# Patient Record
Sex: Female | Born: 2000 | Race: White | Hispanic: No | Marital: Single | Smoking: Never smoker
Health system: Southern US, Community
[De-identification: ages and names within clinical notes are randomized; demographics above are authoritative.]

---
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
## 2020-01-15 ENCOUNTER — Other Ambulatory Visit: Payer: Self-pay

## 2020-01-16 ENCOUNTER — Encounter: Payer: Self-pay | Admitting: Obstetrics & Gynecology

## 2020-01-16 ENCOUNTER — Ambulatory Visit (INDEPENDENT_AMBULATORY_CARE_PROVIDER_SITE_OTHER): Payer: Managed Care, Other (non HMO) | Admitting: Obstetrics & Gynecology

## 2020-01-16 VITALS — BP 112/72 | Ht 65.0 in | Wt 131.0 lb

## 2020-01-16 DIAGNOSIS — Z113 Encounter for screening for infections with a predominantly sexual mode of transmission: Secondary | ICD-10-CM | POA: Diagnosis not present

## 2020-01-16 DIAGNOSIS — Z01419 Encounter for gynecological examination (general) (routine) without abnormal findings: Secondary | ICD-10-CM

## 2020-01-16 DIAGNOSIS — Z3041 Encounter for surveillance of contraceptive pills: Secondary | ICD-10-CM | POA: Diagnosis not present

## 2020-01-16 MED ORDER — BLISOVI 24 FE 1-20 MG-MCG(24) PO TABS: ORAL_TABLET | ORAL | 4 refills | Status: DC

## 2020-01-16 NOTE — Progress Notes (Signed)
Misty Randolph 2001/01/22 [id number]   History:    19 y.o. G0 single.  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  RP:  Established patient presenting for annual gyn exam   HPI: Very well on Lomedia Fe 24 1/20.  Minimal BTB just prior to period.  No pelvic pain.  Sexually active with stable boyfriend.  No pain with IC.  No abnormal d/c.  Breasts wnl.  Urine/BMs normal.  BMI 21.8.  Very fit.  Received Gardasil previously.   Past medical history,surgical history, family history and social history were all reviewed and documented in the EPIC chart.  Gynecologic History Patient's last menstrual period was 01/02/2020.  Obstetric History OB History  Gravida Para Term Preterm AB Living  0 0 0 0 0 0  SAB TAB Ectopic Multiple Live Births  0 0 0 0 0     ROS: A ROS was performed and pertinent positives and negatives are included in the history.  GENERAL: No fevers or chills. HEENT: No change in vision, no earache, sore throat or sinus congestion. NECK: No pain or stiffness. CARDIOVASCULAR: No chest pain or pressure. No palpitations. PULMONARY: No shortness of breath, cough or wheeze. GASTROINTESTINAL: No abdominal pain, nausea, vomiting or diarrhea, melena or bright red blood per rectum. GENITOURINARY: No urinary frequency, urgency, hesitancy or dysuria. MUSCULOSKELETAL: No joint or muscle pain, no back pain, no recent trauma. DERMATOLOGIC: No rash, no itching, no lesions. ENDOCRINE: No polyuria, polydipsia, no heat or cold intolerance. No recent change in weight. HEMATOLOGICAL: No anemia or easy bruising or bleeding. NEUROLOGIC: No headache, seizures, numbness, tingling or weakness. PSYCHIATRIC: No depression, no loss of interest in normal activity or change in sleep pattern.     Exam:   BP 112/72   Ht 5\' 5"  (1.651 m)   Wt 131 lb (59.4 kg)   LMP 01/02/2020 Comment: PILL  BMI 21.80 kg/m   Body mass index is 21.8 kg/m.  General appearance : Well developed well nourished female. No acute distress HEENT: Eyes:  no retinal hemorrhage or exudates,  Neck supple, trachea midline, no carotid bruits, no thyroidmegaly Lungs: Clear to auscultation, no rhonchi or wheezes, or rib retractions  Heart: Regular rate and rhythm, no murmurs or gallops Breast:Examined in sitting and supine position were symmetrical in appearance, no palpable masses or tenderness,  no skin retraction, no nipple inversion, no nipple discharge, no skin discoloration, no axillary or supraclavicular lymphadenopathy Abdomen: no palpable masses or tenderness, no rebound or guarding Extremities: no edema or skin discoloration or tenderness  Pelvic: Vulva: Normal             Vagina: No gross lesions or discharge  Cervix: No gross lesions or discharge.  Gono-Chlam done.  Uterus  AV, normal size, shape and consistency, non-tender and mobile  Adnexa  Without masses or tenderness  Anus: Normal   Assessment/Plan:  19 y.o. female for annual exam   1. Well female exam with routine gynecological exam Normal gynecologic exam.  Will start Pap test at age 14.  Breast exam normal.  Good body mass index at 21.8.  Continue with fitness and healthy nutrition.  2. Encounter for surveillance of contraceptive pills Well on Blisovi 24 FE 1/20.  Very light breakthrough bleeding just prior to the periods.  Will change birth control pills if worsens.  No contraindication to continue on birth control pills.  Prescription sent to pharmacy.  3. Screen for STD (sexually transmitted disease) Recommend condom use.  STI screen with testing for gonorrhea and chlamydia today. -  C. trachomatis/N. gonorrhoeae RNA  Other orders - Norethindrone Acetate-Ethinyl Estrad-FE (BLISOVI 24 FE) 1-20 MG-MCG(24) tablet; 1 tab PO daily  Princess Bruins MD, 2:35 PM 01/16/2020

## 2020-01-16 NOTE — Patient Instructions (Signed)
1. Well female exam with routine gynecological exam Normal gynecologic exam.  Will start Pap test at age 19.  Breast exam normal.  Good body mass index at 21.8.  Continue with fitness and healthy nutrition.  2. Encounter for surveillance of contraceptive pills Well on Blisovi 24 FE 1/20.  Very light breakthrough bleeding just prior to the periods.  Will change birth control pills if worsens.  No contraindication to continue on birth control pills.  Prescription sent to pharmacy.  3. Screen for STD (sexually transmitted disease) Recommend condom use.  STI screen with testing for gonorrhea and chlamydia today. - C. trachomatis/N. gonorrhoeae RNA  Other orders - Norethindrone Acetate-Ethinyl Estrad-FE (BLISOVI 24 FE) 1-20 MG-MCG(24) tablet; 1 tab PO daily  Story, it was a pleasure seeing you today!  I will inform you of your results as soon as they are available.

## 2020-01-18 LAB — C. TRACHOMATIS/N. GONORRHOEAE RNA
C. trachomatis RNA, TMA: NOT DETECTED
N. gonorrhoeae RNA, TMA: NOT DETECTED

## 2021-01-16 ENCOUNTER — Encounter: Payer: Managed Care, Other (non HMO) | Admitting: Obstetrics & Gynecology

## 2021-01-24 ENCOUNTER — Other Ambulatory Visit: Payer: Self-pay

## 2021-01-24 ENCOUNTER — Ambulatory Visit (INDEPENDENT_AMBULATORY_CARE_PROVIDER_SITE_OTHER): Payer: Managed Care, Other (non HMO) | Admitting: Obstetrics & Gynecology

## 2021-01-24 ENCOUNTER — Encounter: Payer: Self-pay | Admitting: Obstetrics & Gynecology

## 2021-01-24 VITALS — BP 118/70 | Ht 65.0 in | Wt 136.0 lb

## 2021-01-24 DIAGNOSIS — N898 Other specified noninflammatory disorders of vagina: Secondary | ICD-10-CM

## 2021-01-24 DIAGNOSIS — Z01419 Encounter for gynecological examination (general) (routine) without abnormal findings: Secondary | ICD-10-CM

## 2021-01-24 DIAGNOSIS — Z3041 Encounter for surveillance of contraceptive pills: Secondary | ICD-10-CM

## 2021-01-24 LAB — WET PREP FOR TRICH, YEAST, CLUE

## 2021-01-24 MED ORDER — FLUCONAZOLE 150 MG PO TABS: 150.0000 mg | ORAL_TABLET | Freq: Every day | ORAL | 3 refills | Status: AC

## 2021-01-24 MED ORDER — BLISOVI 24 FE 1-20 MG-MCG(24) PO TABS
ORAL_TABLET | ORAL | 5 refills | Status: DC
Start: 2021-01-24 — End: 2021-03-07

## 2021-01-24 MED ORDER — TINIDAZOLE 500 MG PO TABS: 1000.0000 mg | ORAL_TABLET | Freq: Two times a day (BID) | ORAL | 0 refills | Status: AC

## 2021-01-24 NOTE — Progress Notes (Signed)
Misty Randolph 01/26/2001 [id number]   History:    20 y.o. G0 single. Sophomore at [employer].  [id number] presenting for annual gyn exam   KYH:CWCB well on Lomedia Fe 24 1/20. Minimal BTB just prior to period. No pelvic pain. Sexually active with stable boyfriend.  No pain with IC.  No abnormal d/c. Breasts wnl. Urine/BMs normal. BMI 22.63. Very fit. Received Gardasil previously.  Past medical history,surgical history, family history and social history were all reviewed and documented in the EPIC chart.  Gynecologic History Patient's last menstrual period was 11/24/2020.  Obstetric History OB History  Gravida Para Term Preterm AB Living  0 0 0 0 0 0  SAB IAB Ectopic Multiple Live Births  0 0 0 0 0     ROS: A ROS was performed and pertinent positives and negatives are included in the history.  GENERAL: No fevers or chills. HEENT: No change in vision, no earache, sore throat or sinus congestion. NECK: No pain or stiffness. CARDIOVASCULAR: No chest pain or pressure. No palpitations. PULMONARY: No shortness of breath, cough or wheeze. GASTROINTESTINAL: No abdominal pain, nausea, vomiting or diarrhea, melena or bright red blood per rectum. GENITOURINARY: No urinary frequency, urgency, hesitancy or dysuria. MUSCULOSKELETAL: No joint or muscle pain, no back pain, no recent trauma. DERMATOLOGIC: No rash, no itching, no lesions. ENDOCRINE: No polyuria, polydipsia, no heat or cold intolerance. No recent change in weight. HEMATOLOGICAL: No anemia or easy bruising or bleeding. NEUROLOGIC: No headache, seizures, numbness, tingling or weakness. PSYCHIATRIC: No depression, no loss of interest in normal activity or change in sleep pattern.     Exam:   BP 118/70   Ht 5\' 5"  (1.651 m)   Wt 136 lb (61.7 kg)   LMP 11/24/2020 Comment: pill  BMI 22.63 kg/m   Body mass index is 22.63 kg/m.  General appearance : Well developed well nourished female. No acute distress HEENT:  Eyes: no retinal hemorrhage or exudates,  Neck supple, trachea midline, no carotid bruits, no thyroidmegaly Lungs: Clear to auscultation, no rhonchi or wheezes, or rib retractions  Heart: Regular rate and rhythm, no murmurs or gallops Breast:Examined in sitting and supine position were symmetrical in appearance, no palpable masses or tenderness,  no skin retraction, no nipple inversion, no nipple discharge, no skin discoloration, no axillary or supraclavicular lymphadenopathy Abdomen: no palpable masses or tenderness, no rebound or guarding Extremities: no edema or skin discoloration or tenderness  Pelvic: Vulva: Normal             Vagina: No gross lesions.  Thick vaginal d/c c/w yeast.  Wet prep done.  Cervix: No gross lesions or discharge  Uterus  AV, normal size, shape and consistency, non-tender and mobile  Adnexa  Without masses or tenderness  Anus: Normal  Wet prep:  Yeasts and Clue Cells present   Assessment/Plan:  20 y.o. female for annual exam   1. Well female exam with routine gynecological exam Normal gynecologic exam.  We will start Pap test next year at age 7.  Declines STI screening.  Breast exam normal.  Good body mass index at 22.63.  Continue with fitness and healthy nutrition.  2. Encounter for surveillance of contraceptive pills Well on Blisovi 24 FE 1/20.  Continuous use.  Occasional mild breakthrough bleeding.  No contraindication to continue.  Prescription sent to pharmacy.  3. Vaginal discharge Wet prep confirmed both bacterial vaginosis and yeast vaginitis.  Counseling done.  Will treat with tinidazole 2 tablets per mouth twice a  day for 2 days and fluconazole 150 mg 1 tablet per mouth daily for 3 days.  Fluconazole refills sent. - WET PREP FOR TRICH, YEAST, CLUE  Other orders - Norethindrone Acetate-Ethinyl Estrad-FE (BLISOVI 24 FE) 1-20 MG-MCG(24) tablet; 1 tab PO daily, may use continuously - tinidazole (TINDAMAX) 500 MG tablet; Take 2 tablets (1,000 mg  total) by mouth 2 (two) times daily for 2 days. - fluconazole (DIFLUCAN) 150 MG tablet; Take 1 tablet (150 mg total) by mouth daily for 3 days.  Genia Del MD, 1:53 PM 01/24/2021

## 2021-03-07 ENCOUNTER — Other Ambulatory Visit: Payer: Self-pay | Admitting: Obstetrics & Gynecology

## 2021-10-13 ENCOUNTER — Telehealth: Payer: Self-pay | Admitting: *Deleted

## 2021-10-13 NOTE — Telephone Encounter (Signed)
Patient called and left message in triage voicemail c/o vaginal odor reports it BV. She asked if Rx could be sent to pharmacy, patient is away in college. I advised patient office visit is needed for this other option would be schedule visit at student health. Patient verbalized she understood.

## 2022-01-27 ENCOUNTER — Other Ambulatory Visit (HOSPITAL_COMMUNITY)
Admission: RE | Admit: 2022-01-27 | Discharge: 2022-01-27 | Disposition: A | Discharge status: Home / Self Care | Payer: Self-pay | Source: Ambulatory Visit | Attending: Obstetrics & Gynecology | Admitting: Obstetrics & Gynecology

## 2022-01-27 ENCOUNTER — Encounter: Payer: Self-pay | Admitting: Obstetrics & Gynecology

## 2022-01-27 ENCOUNTER — Ambulatory Visit (INDEPENDENT_AMBULATORY_CARE_PROVIDER_SITE_OTHER): Payer: Managed Care, Other (non HMO) | Admitting: Obstetrics & Gynecology

## 2022-01-27 VITALS — BP 106/70 | HR 68 | Resp 16 | Ht 65.25 in | Wt 144.0 lb

## 2022-01-27 DIAGNOSIS — Z01419 Encounter for gynecological examination (general) (routine) without abnormal findings: Secondary | ICD-10-CM

## 2022-01-27 DIAGNOSIS — N898 Other specified noninflammatory disorders of vagina: Secondary | ICD-10-CM | POA: Diagnosis not present

## 2022-01-27 DIAGNOSIS — Z3041 Encounter for surveillance of contraceptive pills: Secondary | ICD-10-CM | POA: Diagnosis not present

## 2022-01-27 LAB — WET PREP FOR TRICH, YEAST, CLUE

## 2022-01-27 MED ORDER — HAILEY 24 FE 1-20 MG-MCG(24) PO TABS: ORAL_TABLET | ORAL | 4 refills | Status: DC

## 2022-01-27 MED ORDER — TINIDAZOLE 500 MG PO TABS: 1000.0000 mg | ORAL_TABLET | Freq: Two times a day (BID) | ORAL | 0 refills | Status: AC

## 2022-01-27 MED ORDER — FLUCONAZOLE 150 MG PO TABS: 150.0000 mg | ORAL_TABLET | ORAL | 0 refills | Status: AC

## 2022-01-27 NOTE — Progress Notes (Signed)
Misty Randolph Nov 17, 2000 [id number]   History:    21 y.o.  G0 single.  Graduated from [employer].  Working in [employer] in [employer].   RP:  Established patient presenting for annual gyn exam    HPI: Very well on Lomedia Fe 24 1/20.  No BTB.  Normal light periods.  No pelvic pain.  Sexually active with stable boyfriend.  No pain with IC.  C/O vaginal discharge with odor.  Treated with ABTx.  Still has a discharge.   Breasts wnl.  Urine/BMs normal. BMI 23.78.  Very fit.  Received Gardasil previously.    Past medical history,surgical history, family history and social history were all reviewed and documented in the EPIC chart.  Gynecologic History No LMP recorded. (Menstrual status: Oral contraceptives).  Obstetric History OB History  Gravida Para Term Preterm AB Living  0 0 0 0 0 0  SAB IAB Ectopic Multiple Live Births  0 0 0 0 0     ROS: A ROS was performed and pertinent positives and negatives are included in the history.  GENERAL: No fevers or chills. HEENT: No change in vision, no earache, sore throat or sinus congestion. NECK: No pain or stiffness. CARDIOVASCULAR: No chest pain or pressure. No palpitations. PULMONARY: No shortness of breath, cough or wheeze. GASTROINTESTINAL: No abdominal pain, nausea, vomiting or diarrhea, melena or bright red blood per rectum. GENITOURINARY: No urinary frequency, urgency, hesitancy or dysuria. MUSCULOSKELETAL: No joint or muscle pain, no back pain, no recent trauma. DERMATOLOGIC: No rash, no itching, no lesions. ENDOCRINE: No polyuria, polydipsia, no heat or cold intolerance. No recent change in weight. HEMATOLOGICAL: No anemia or easy bruising or bleeding. NEUROLOGIC: No headache, seizures, numbness, tingling or weakness. PSYCHIATRIC: No depression, no loss of interest in normal activity or change in sleep pattern.     Exam:   BP 106/70   Pulse 68   Resp 16   Ht 5' 5.25" (1.657 m)   Wt 144 lb (65.3 kg)   BMI 23.78 kg/m    Body mass index is 23.78 kg/m.  General appearance : Well developed well nourished female. No acute distress HEENT: Eyes: no retinal hemorrhage or exudates,  Neck supple, trachea midline, no carotid bruits, no thyroidmegaly Lungs: Clear to auscultation, no rhonchi or wheezes, or rib retractions  Heart: Regular rate and rhythm, no murmurs or gallops Breast:Examined in sitting and supine position were symmetrical in appearance, no palpable masses or tenderness,  no skin retraction, no nipple inversion, no nipple discharge, no skin discoloration, no axillary or supraclavicular lymphadenopathy Abdomen: no palpable masses or tenderness, no rebound or guarding Extremities: no edema or skin discoloration or tenderness  Pelvic: Vulva: Normal             Vagina: No gross lesions.  Increased thick discharge.  Wet prep done.  Cervix: No gross lesions or discharge.  Pap reflex/Gono-Chlam  Uterus  AV, normal size, shape and consistency, non-tender and mobile  Adnexa  Without masses or tenderness  Anus: Normal  Wet Prep:  Clue cells with odor/Yeasts present   Assessment/Plan:  21 y.o. female for annual exam   1. Encounter for routine gynecological examination with Papanicolaou smear of cervix Very well on Lomedia Fe 24 1/20.  No BTB.  Normal light periods.  No pelvic pain.  Sexually active with stable boyfriend.  No pain with IC.  C/O vaginal discharge with odor.  Treated with ABTx.  Still has a discharge.   Breasts wnl.  Urine/BMs normal. BMI  23.78.  Very fit.  Received Gardasil previously.  - Cytology - PAP( [facility])  2. Encounter for surveillance of contraceptive pills Very well on Lomedia Fe 24 1/20.  No BTB.  Normal light periods.  No pelvic pain.  Sexually active with stable boyfriend.  No pain with IC.  No CI to BCPs.  Lomedia Fe 24 1/20 prescription sent to pharmacy.  3. Vaginal discharge Bacterial vaginosis and Yeast vaginitis confirmed by Wet Prep.  Will treat with Tinidazole,  followed by Fluconazole.  Usage reviewed and prescription sent to pharmacy. - WET PREP FOR TRICH, YEAST, CLUE  Other orders - Probiotic Product (PROBIOTIC PO); Take by mouth. - Norethindrone Acetate-Ethinyl Estrad-FE (HAILEY 24 FE) 1-20 MG-MCG(24) tablet; Take 1 tablet by mouth daily, may use continuously - tinidazole (TINDAMAX) 500 MG tablet; Take 2 tablets (1,000 mg total) by mouth 2 (two) times daily for 2 days. - fluconazole (DIFLUCAN) 150 MG tablet; Take 1 tablet (150 mg total) by mouth every other day for 3 doses.   Genia Del MD, 1:39 PM 01/27/2022

## 2022-01-28 LAB — CYTOLOGY - PAP
Chlamydia: NEGATIVE
Comment: NEGATIVE
Comment: NORMAL
Diagnosis: NEGATIVE
Neisseria Gonorrhea: NEGATIVE

## 2022-04-23 ENCOUNTER — Other Ambulatory Visit: Payer: Self-pay | Admitting: Obstetrics & Gynecology

## 2022-09-16 ENCOUNTER — Other Ambulatory Visit: Payer: Self-pay | Admitting: Obstetrics & Gynecology

## 2022-09-16 NOTE — Telephone Encounter (Signed)
Pharmacy comment: Alternative Requested:WE HAVE BEEN ORDERING. ON BACKORDER.

## 2023-02-03 ENCOUNTER — Ambulatory Visit (INDEPENDENT_AMBULATORY_CARE_PROVIDER_SITE_OTHER): Payer: Managed Care, Other (non HMO) | Admitting: Obstetrics & Gynecology

## 2023-02-03 ENCOUNTER — Encounter: Payer: Self-pay | Admitting: Obstetrics & Gynecology

## 2023-02-03 ENCOUNTER — Other Ambulatory Visit (HOSPITAL_COMMUNITY)
Admission: RE | Admit: 2023-02-03 | Discharge: 2023-02-03 | Disposition: A | Discharge status: Home / Self Care | Payer: Self-pay | Source: Ambulatory Visit | Attending: Obstetrics & Gynecology | Admitting: Obstetrics & Gynecology

## 2023-02-03 VITALS — HR 78 | Ht 65.0 in | Wt 148.0 lb

## 2023-02-03 DIAGNOSIS — N760 Acute vaginitis: Secondary | ICD-10-CM | POA: Diagnosis not present

## 2023-02-03 DIAGNOSIS — Z01419 Encounter for gynecological examination (general) (routine) without abnormal findings: Secondary | ICD-10-CM | POA: Insufficient documentation

## 2023-02-03 DIAGNOSIS — Z3041 Encounter for surveillance of contraceptive pills: Secondary | ICD-10-CM

## 2023-02-03 DIAGNOSIS — B3731 Acute candidiasis of vulva and vagina: Secondary | ICD-10-CM

## 2023-02-03 DIAGNOSIS — N898 Other specified noninflammatory disorders of vagina: Secondary | ICD-10-CM

## 2023-02-03 LAB — WET PREP FOR TRICH, YEAST, CLUE

## 2023-02-03 MED ORDER — FLUCONAZOLE 150 MG PO TABS: 150.0000 mg | ORAL_TABLET | ORAL | 2 refills | Status: AC

## 2023-02-03 MED ORDER — TINIDAZOLE 500 MG PO TABS: 1000.0000 mg | ORAL_TABLET | Freq: Two times a day (BID) | ORAL | 2 refills | Status: AC

## 2023-02-03 MED ORDER — HAILEY 24 FE 1-20 MG-MCG(24) PO TABS: ORAL_TABLET | ORAL | 4 refills | Status: DC

## 2023-02-03 NOTE — Progress Notes (Signed)
Misty Randolph 08-25-2001 [id number]   History:    22 y.o. G0 single.  Graduated from [employer].  Working in Freight forwarder in [employer].   RP:  Established patient presenting for annual gyn exam    HPI: Very well on Lomedia Fe 24 1/20.  No BTB. Normal light periods. No pelvic pain.  Sexually active with stable boyfriend. No pain with IC.  Pap Neg 01/2022. Pap reflex today.  C/O vaginal discharge with odor. Breasts wnl.  Urine/BMs normal. BMI 24.63.  Very fit. Received Gardasil previously.   Past medical history,surgical history, family history and social history were all reviewed and documented in the EPIC chart.  Gynecologic History No LMP recorded. (Menstrual status: Oral contraceptives).  Obstetric History OB History  Gravida Para Term Preterm AB Living  0 0 0 0 0 0  SAB IAB Ectopic Multiple Live Births  0 0 0 0 0     ROS: A ROS was performed and pertinent positives and negatives are included in the history. GENERAL: No fevers or chills. HEENT: No change in vision, no earache, sore throat or sinus congestion. NECK: No pain or stiffness. CARDIOVASCULAR: No chest pain or pressure. No palpitations. PULMONARY: No shortness of breath, cough or wheeze. GASTROINTESTINAL: No abdominal pain, nausea, vomiting or diarrhea, melena or bright red blood per rectum. GENITOURINARY: No urinary frequency, urgency, hesitancy or dysuria. MUSCULOSKELETAL: No joint or muscle pain, no back pain, no recent trauma. DERMATOLOGIC: No rash, no itching, no lesions. ENDOCRINE: No polyuria, polydipsia, no heat or cold intolerance. No recent change in weight. HEMATOLOGICAL: No anemia or easy bruising or bleeding. NEUROLOGIC: No headache, seizures, numbness, tingling or weakness. PSYCHIATRIC: No depression, no loss of interest in normal activity or change in sleep pattern.     Exam:   Pulse 78   Ht 5\' 5"  (1.651 m)   Wt 148 lb (67.1 kg)   SpO2 97%   BMI 24.63 kg/m   Body mass index is 24.63  kg/m.  General appearance : Well developed well nourished female. No acute distress HEENT: Eyes: no retinal hemorrhage or exudates,  Neck supple, trachea midline, no carotid bruits, no thyroidmegaly Lungs: Clear to auscultation, no rhonchi or wheezes, or rib retractions  Heart: Regular rate and rhythm, no murmurs or gallops Breast:Examined in sitting and supine position were symmetrical in appearance, no palpable masses or tenderness,  no skin retraction, no nipple inversion, no nipple discharge, no skin discoloration, no axillary or supraclavicular lymphadenopathy Abdomen: no palpable masses or tenderness, no rebound or guarding Extremities: no edema or skin discoloration or tenderness  Pelvic: Vulva: Normal             Vagina: No gross lesions. Thick discharge present.  Wet prep done.  Cervix: No gross lesions.  Pap reflex done.  Uterus  AV, normal size, shape and consistency, non-tender and mobile  Adnexa  Without masses or tenderness  Anus: Normal  Wet Prep:  Clue cells with odor and yeasts present with pseudohyphae.   Assessment/Plan:  22 y.o. female for annual exam   1. Encounter for routine gynecological examination with Papanicolaou smear of cervix Very well on Lomedia Fe 24 1/20.  No BTB. Normal light periods. No pelvic pain.  Sexually active with stable boyfriend. No pain with IC.  Pap Neg 01/2022. Pap reflex today.  C/O vaginal discharge with odor. Breasts wnl.  Urine/BMs normal. BMI 24.63.  Very fit. Received Gardasil previously. - Cytology - PAP( [place])  2. Encounter for surveillance of contraceptive pills  Very well on Lomedia Fe 24 1/20.  No BTB. Normal light periods. No pelvic pain.  Sexually active with stable boyfriend. No pain with IC.  No CI to continue with the BCPs.  Prescription sent to pharmacy.  3. Vaginal discharge BV and yeast vaginitis confirmed by Wet prep.  Tinidazole/Fluconazole prescriptions sent to pharmacy. - WET PREP FOR TRICH, YEAST,  CLUE  Other orders - Norethindrone Acetate-Ethinyl Estrad-FE (HAILEY 24 FE) 1-20 MG-MCG(24) tablet; 1 tab PO daily - tinidazole (TINDAMAX) 500 MG tablet; Take 2 tablets (1,000 mg total) by mouth 2 (two) times daily for 2 days. - fluconazole (DIFLUCAN) 150 MG tablet; Take 1 tablet (150 mg total) by mouth every other day for 3 doses.   Genia Del MD, 3:59 PM

## 2023-02-05 ENCOUNTER — Telehealth: Payer: Self-pay

## 2023-02-05 NOTE — Telephone Encounter (Signed)
Pt LVM in triage line stating that she was recently prescribed fluconazole for yeast and has concerns because the instructions of the prescription says to take for 3 doses. However, the pharmacy only dispensed 2 doses due to insurance only covering for 2. So, pt is inquiring what she should do about this? Please advise.

## 2023-02-08 NOTE — Telephone Encounter (Signed)
Per ML: "Just take two, it should be enough. Dr L"  CB, no answer, LDVM on machine per DPR.

## 2023-02-10 LAB — CYTOLOGY - PAP
Diagnosis: NEGATIVE
Diagnosis: REACTIVE

## 2024-02-08 ENCOUNTER — Encounter: Payer: Self-pay | Admitting: Obstetrics and Gynecology

## 2024-02-08 ENCOUNTER — Ambulatory Visit (INDEPENDENT_AMBULATORY_CARE_PROVIDER_SITE_OTHER): Admitting: Obstetrics and Gynecology

## 2024-02-08 VITALS — BP 122/64 | HR 70 | Ht 65.25 in | Wt 152.0 lb

## 2024-02-08 DIAGNOSIS — Z113 Encounter for screening for infections with a predominantly sexual mode of transmission: Secondary | ICD-10-CM | POA: Diagnosis not present

## 2024-02-08 DIAGNOSIS — Z3041 Encounter for surveillance of contraceptive pills: Secondary | ICD-10-CM | POA: Diagnosis not present

## 2024-02-08 DIAGNOSIS — Z01419 Encounter for gynecological examination (general) (routine) without abnormal findings: Secondary | ICD-10-CM | POA: Insufficient documentation

## 2024-02-08 DIAGNOSIS — Z1331 Encounter for screening for depression: Secondary | ICD-10-CM | POA: Diagnosis not present

## 2024-02-08 MED ORDER — HAILEY 24 FE 1-20 MG-MCG(24) PO TABS: ORAL_TABLET | ORAL | 4 refills | Status: AC

## 2024-02-08 NOTE — Patient Instructions (Signed)

## 2024-02-08 NOTE — Assessment & Plan Note (Signed)
 Cervical cancer screening performed according to ASCCP guidelines. Labs and immunizations with her primary Encouraged safe sexual practices as indicated Encouraged healthy lifestyle practices with diet and exercise For patients under 23yo, I recommend 1000mg  calcium daily and 600IU of vitamin D daily.

## 2024-02-08 NOTE — Progress Notes (Signed)
 23 y.o. G0P0000 female on COC here for annual exam. Boyfriend x21yr. Graduated from [employer].  Working in Freight forwarder in [employer].   No LMP recorded. (Menstrual status: Oral contraceptives).  No complaints, doing well.  Abnormal bleeding: none Pelvic discharge or pain: none Breast mass, nipple discharge or skin changes : none Birth control: COC Last PAP:     Component Value Date/Time   DIAGPAP  02/03/2023 1613    - Negative for Intraepithelial Lesions or Malignancy (NILM)   DIAGPAP - Benign reactive/reparative changes 02/03/2023 1613   DIAGPAP  01/27/2022 1403    - Negative for intraepithelial lesion or malignancy (NILM)   ADEQPAP  02/03/2023 1613    Satisfactory for evaluation; transformation zone component PRESENT.   ADEQPAP  01/27/2022 1403    Satisfactory for evaluation; transformation zone component PRESENT.   Gardasil: completed per patient Sexually active: yes  Exercising: Yes. Gym/ health club routine includes cardio, mod to heavy weightlifting, and walking and sports.  Smoker: no  Garment/textile technologist Visit from 02/08/[facility]24 in [facility]  PHQ-2 Total Score 0          GYN HISTORY: No sig hx  OB History  Gravida Para Term Preterm AB Living  0 0 0 0 0 0  SAB IAB Ectopic Multiple Live Births  0 0 0 0 0   History reviewed. No pertinent past medical history. History reviewed. No pertinent surgical history. No current outpatient medications on file prior to visit.   No current facility-administered medications on file prior to visit.   Social History   Socioeconomic History   Marital status: Single    Spouse name: Not on file   Number of children: Not on file   Years of education: Not on file   Highest education level: Not on file  Occupational History   Not on file  Tobacco Use   Smoking status: Never   Smokeless tobacco: Never  Vaping Use   Vaping status: Never Used  Substance and Sexual Activity    Alcohol use: Yes    Comment: occ   Drug use: No   Sexual activity: Yes    Partners: Male    Birth control/protection: OCP    Comment: 1st intercourse- 52  Other Topics Concern   Not on file  Social History Narrative   Not on file   Social Drivers of Health   Financial Resource Strain: Not on file  Food Insecurity: Not on file  Transportation Needs: Not on file  Physical Activity: Not on file  Stress: Not on file  Social Connections: Not on file  Intimate Partner Violence: Not on file   History reviewed. No pertinent family history. No Known Allergies  PE Today's Vitals   02/08/24 1117  BP: 122/64  Pulse: 70  SpO2: 99%  Weight: 152 lb (68.9 kg)  Height: 5' 5.25" (1.657 m)   Body mass index is 25.1 kg/m.  Physical Exam Vitals reviewed. Exam conducted with a chaperone present.  Constitutional:      General: She is not in acute distress.    Appearance: Normal appearance.  HENT:     Head: Normocephalic and atraumatic.     Nose: Nose normal.  Eyes:     Extraocular Movements: Extraocular movements intact.     Conjunctiva/sclera: Conjunctivae normal.  Neck:     Thyroid: No thyroid mass, thyromegaly or thyroid tenderness.  Pulmonary:     Effort: Pulmonary effort is normal.  Abdominal:  General: There is no distension.     Palpations: Abdomen is soft.     Tenderness: There is no abdominal tenderness.  Genitourinary:    General: Normal vulva.     Exam position: Lithotomy position.     Urethra: No prolapse.     Vagina: Normal. No vaginal discharge or bleeding.     Cervix: Normal. No lesion.     Uterus: Normal. Not enlarged and not tender.      Adnexa: Right adnexa normal and left adnexa normal.  Musculoskeletal:        General: Normal range of motion.     Cervical back: Normal range of motion.  Lymphadenopathy:     Lower Body: No right inguinal adenopathy. No left inguinal adenopathy.  Skin:    General: Skin is warm and dry.  Neurological:     General: No  focal deficit present.     Mental Status: She is alert.  Psychiatric:        Mood and Affect: Mood normal.        Behavior: Behavior normal.      Assessment and Plan:        Well woman exam with routine gynecological exam Assessment & Plan: Cervical cancer screening performed according to ASCCP guidelines. Labs and immunizations with her primary Encouraged safe sexual practices as indicated Encouraged healthy lifestyle practices with diet and exercise For patients under 50yo, I recommend 1000mg  calcium daily and 600IU of vitamin D daily.     Encounter for surveillance of contraceptive pills -     Hailey  24 Fe; 1 tab PO daily  Dispense: 84 tablet; Refill: 4  Screen for STD (sexually transmitted disease) -     C. trachomatis/N. gonorrhoeae RNA   Romaine Closs, MD

## 2024-02-09 LAB — C. TRACHOMATIS/N. GONORRHOEAE RNA
C. trachomatis RNA, TMA: NOT DETECTED
N. gonorrhoeae RNA, TMA: NOT DETECTED

## 2024-02-10 ENCOUNTER — Ambulatory Visit: Payer: Self-pay | Admitting: Obstetrics and Gynecology

## 2024-03-21 ENCOUNTER — Other Ambulatory Visit: Payer: Self-pay | Admitting: Obstetrics & Gynecology

## 2024-03-21 DIAGNOSIS — Z3041 Encounter for surveillance of contraceptive pills: Secondary | ICD-10-CM

## 2024-03-22 ENCOUNTER — Other Ambulatory Visit: Payer: Self-pay | Admitting: Obstetrics & Gynecology

## 2024-03-22 DIAGNOSIS — Z3041 Encounter for surveillance of contraceptive pills: Secondary | ICD-10-CM

## 2024-03-22 NOTE — Telephone Encounter (Signed)
 Medication refill request: Misty Randolph  42fe Last AEX:  02-08-24 Next AEX: 02-13-25 Last MMG (if hormonal medication request): none Refill authorized: rx was sent at her [facility] for 97yr. Patient is aware. This rx denied. This was also the incorrect pharmacy that sent this refill.

## 2024-03-23 ENCOUNTER — Telehealth: Payer: Self-pay

## 2024-03-23 NOTE — Telephone Encounter (Signed)
 Patient LM on RF line. Spoke to patient, patient states she had #1 pack of COC's filled at [facility] (patient was out of town and needed emergency pack).  Patient states now she is unable to get her COC RX filled at [facility].  I spoke to the pharmacist at [facility], he said RX was put in by [facility] that #3 packs were filled 01/01/24 and they cannot fill the RX until 04/01/24.  Pharmacist states the [facility] will need to [facility] [place] and discuss with them.  I called the patient and advised her of above.  [facility] states [facility]  and talk with the pharmacist.

## 2025-02-13 ENCOUNTER — Ambulatory Visit: Admitting: Obstetrics and Gynecology
